# Patient Record
Sex: Male | Born: 1950 | Race: White | Hispanic: No | Marital: Married | Smoking: Never smoker
Health system: Southern US, Community
[De-identification: ages and names within clinical notes are randomized; demographics above are authoritative.]

## PROBLEM LIST (undated history)

## (undated) HISTORY — PX: NO PAST SURGERIES: SHX2092

---

## 2015-04-11 DIAGNOSIS — R1030 Lower abdominal pain, unspecified: Secondary | ICD-10-CM | POA: Diagnosis not present

## 2015-04-19 ENCOUNTER — Encounter: Payer: Self-pay | Admitting: *Deleted

## 2015-04-20 DIAGNOSIS — Z7689 Persons encountering health services in other specified circumstances: Secondary | ICD-10-CM | POA: Diagnosis not present

## 2015-04-20 DIAGNOSIS — Z125 Encounter for screening for malignant neoplasm of prostate: Secondary | ICD-10-CM | POA: Diagnosis not present

## 2015-04-20 DIAGNOSIS — Z0000 Encounter for general adult medical examination without abnormal findings: Secondary | ICD-10-CM | POA: Diagnosis not present

## 2015-04-28 ENCOUNTER — Ambulatory Visit (INDEPENDENT_AMBULATORY_CARE_PROVIDER_SITE_OTHER): Payer: 59 | Admitting: General Surgery

## 2015-04-28 ENCOUNTER — Encounter: Payer: Self-pay | Admitting: General Surgery

## 2015-04-28 VITALS — BP 110/68 | HR 68 | Resp 12 | Ht 68.0 in | Wt 175.0 lb

## 2015-04-28 DIAGNOSIS — K4090 Unilateral inguinal hernia, without obstruction or gangrene, not specified as recurrent: Secondary | ICD-10-CM

## 2015-04-28 NOTE — Progress Notes (Signed)
Patient ID: Alejandro Martinez, male   DOB: Aug 07, 1950, 65 y.o.   MRN: [id number]  Chief Complaint  Patient presents with  . Hernia    HPI Alejandro Martinez is a 65 y.o. male.  Here today for evaluation of inguinal hernia. He states he noticed a left groin knot about 3-4 weeks ago.  He was helping a friend move some snack/soda machines when he first became aware of discomfort in the area.He denies pain he states it is uncomfortable with sitting for prolonged periods of time. Bowels are regular. Her difficulty with urination. He works part time at [employer].  I personally reviewed the patient's history.  HPI  No past medical history on file.  No past surgical history on file.  Family History  Problem Relation Age of Onset  . Cancer Neg Hx     Social History Social History  Substance Use Topics  . Smoking status: Never Smoker   . Smokeless tobacco: Never Used  . Alcohol Use: No    No Known Allergies  No current outpatient prescriptions on file.   No current facility-administered medications for this visit.    Review of Systems Review of Systems  Constitutional: Negative.   Respiratory: Negative.   Cardiovascular: Negative.     Blood pressure 110/68, pulse 68, resp. rate 12, height 5\' 8"  (1.727 m), weight 175 lb (79.379 kg).  Physical Exam Physical Exam  Constitutional: He is oriented to person, place, and time. He appears well-developed and well-nourished.  HENT:  Mouth/Throat: Oropharynx is clear and moist.  Eyes: Conjunctivae are normal. No scleral icterus.  Neck: Neck supple.  Cardiovascular: Normal rate, regular rhythm and normal heart sounds.   Pulmonary/Chest: Effort normal and breath sounds normal.  Abdominal: A hernia is present. Hernia confirmed positive in the left inguinal area.  Diastasis recti present.  Genitourinary:     Left reducible inguinal hernia as well as a 2 cm dermal cyst.  Lymphadenopathy:    He has no cervical adenopathy.   Neurological: He is alert and oriented to person, place, and time.  Skin: Skin is warm and dry.  Psychiatric: His behavior is normal.    Data Reviewed  Notes from [facility] walls, D.O. of 04/11/2015 reviewed. Cystic mass noted in the left groin.  Assessment:    Reducible left inguinal hernia.  Sebaceous cyst of the left groin skin.   Plan        Hernia precautions and incarceration were discussed with the patient. If they develop symptoms of an incarcerated hernia, they were encouraged to seek prompt medical attention.  Cyst excision would be undertaken at the same setting to minimize the risk of it later infection.   I have recommended repair of the hernia using mesh on an outpatient basis in the near future. The risk of infection was reviewed. The role of prosthetic mesh to minimize the risk of recurrence was reviewed.  Patient's surgery has been scheduled for 05-05-15 at [facility].    PCP: Dr Maryland Pink  This information has been scribed by Karie Fetch RNBC.   Alejandro Martinez 04/29/2015, 9:24 AM

## 2015-04-28 NOTE — Patient Instructions (Addendum)
The patient is aware to call back for any questions or concerns. Inguinal Hernia, Adult Muscles help keep everything in the body in its proper place. But if a weak spot in the muscles develops, something can poke through. That is called a hernia. When this happens in the lower part of the belly (abdomen), it is called an inguinal hernia. (It takes its name from a part of the body in this region called the inguinal canal.) A weak spot in the wall of muscles lets some fat or part of the small intestine bulge through. An inguinal hernia can develop at any age. Men get them more often than women. CAUSES  In adults, an inguinal hernia develops over time.  It can be triggered by:  Suddenly straining the muscles of the lower abdomen.  Lifting heavy objects.  Straining to have a bowel movement. Difficult bowel movements (constipation) can lead to this.  Constant coughing. This may be caused by smoking or lung disease.  Being overweight.  Being pregnant.  Working at a job that requires long periods of standing or heavy lifting.  Having had an inguinal hernia before. One type can be an emergency situation. It is called a strangulated inguinal hernia. It develops if part of the small intestine slips through the weak spot and cannot get back into the abdomen. The blood supply can be cut off. If that happens, part of the intestine may die. This situation requires emergency surgery. SYMPTOMS  Often, a small inguinal hernia has no symptoms. It is found when a healthcare provider does a physical exam. Larger hernias usually have symptoms.   In adults, symptoms may include:  A lump in the groin. This is easier to see when the person is standing. It might disappear when lying down.  In men, a lump in the scrotum.  Pain or burning in the groin. This occurs especially when lifting, straining or coughing.  A dull ache or feeling of pressure in the groin.  Signs of a strangulated hernia can  include:  A bulge in the groin that becomes very painful and tender to the touch.  A bulge that turns red or purple.  Fever, nausea and vomiting.  Inability to have a bowel movement or to pass gas. DIAGNOSIS  To decide if you have an inguinal hernia, a healthcare provider will probably do a physical examination.  This will include asking questions about any symptoms you have noticed.  The healthcare provider might feel the groin area and ask you to cough. If an inguinal hernia is felt, the healthcare provider may try to slide it back into the abdomen.  Usually no other tests are needed. TREATMENT  Treatments can vary. The size of the hernia makes a difference. Options include:  Watchful waiting. This is often suggested if the hernia is small and you have had no symptoms.  No medical procedure will be done unless symptoms develop.  You will need to watch closely for symptoms. If any occur, contact your healthcare provider right away.  Surgery. This is used if the hernia is larger or you have symptoms.  Open surgery. This is usually an outpatient procedure (you will not stay overnight in a hospital). An cut (incision) is made through the skin in the groin. The hernia is put back inside the abdomen. The weak area in the muscles is then repaired by herniorrhaphy or hernioplasty. Herniorrhaphy: in this type of surgery, the weak muscles are sewn back together. Hernioplasty: a patch or mesh is   used to close the weak area in the abdominal wall.  Laparoscopy. In this procedure, a surgeon makes small incisions. A thin tube with a tiny video camera (called a laparoscope) is put into the abdomen. The surgeon repairs the hernia with mesh by looking with the video camera and using two long instruments. HOME CARE INSTRUCTIONS   After surgery to repair an inguinal hernia:  You will need to take pain medicine prescribed by your healthcare provider. Follow all directions carefully.  You will need  to take care of the wound from the incision.  Your activity will be restricted for awhile. This will probably include no heavy lifting for several weeks. You also should not do anything too active for a few weeks. When you can return to work will depend on the type of job that you have.  During "watchful waiting" periods, you should:  Maintain a healthy weight.  Eat a diet high in fiber (fruits, vegetables and whole grains).  Drink plenty of fluids to avoid constipation. This means drinking enough water and other liquids to keep your urine clear or pale yellow.  Do not lift heavy objects.  Do not stand for long periods of time.  Quit smoking. This should keep you from developing a frequent cough. SEEK MEDICAL CARE IF:   A bulge develops in your groin area.  You feel pain, a burning sensation or pressure in the groin. This might be worse if you are lifting or straining.  You develop a fever of more than 100.5 F (38.1 C). SEEK IMMEDIATE MEDICAL CARE IF:   Pain in the groin increases suddenly.  A bulge in the groin gets bigger suddenly and does not go down.  For men, there is sudden pain in the scrotum. Or, the size of the scrotum increases.  A bulge in the groin area becomes red or purple and is painful to touch.  You have nausea or vomiting that does not go away.  You feel your heart beating much faster than normal.  You cannot have a bowel movement or pass gas.  You develop a fever of more than 102.0 F (38.9 C).   This information is not intended to replace advice given to you by your health care provider. Make sure you discuss any questions you have with your health care provider.   Document Released: 07/29/2008 Document Revised: 06/04/2011 Document Reviewed: 09/13/2014 Elsevier Interactive Patient Education 2016 [employer].  Patient's surgery has been scheduled for 05-05-15 at [facility].

## 2015-04-29 DIAGNOSIS — K469 Unspecified abdominal hernia without obstruction or gangrene: Secondary | ICD-10-CM | POA: Insufficient documentation

## 2015-04-29 DIAGNOSIS — K4090 Unilateral inguinal hernia, without obstruction or gangrene, not specified as recurrent: Secondary | ICD-10-CM | POA: Insufficient documentation

## 2015-04-29 NOTE — H&P (Signed)
HPI  Alejandro Martinez is a 65 y.o. male. Here today for evaluation of inguinal hernia. He states he noticed a left groin knot about 3-4 weeks ago. He was helping a friend move some snack/soda machines when he first became aware of discomfort in the area.He denies pain he states it is uncomfortable with sitting for prolonged periods of time. Bowels are regular. Her difficulty with urination.  He works part time at [employer].  I personally reviewed the patient's history.  HPI  No past medical history on file.  No past surgical history on file.  Family History   Problem  Relation  Age of Onset   .  Cancer  Neg Hx     Social History  Social History   Substance Use Topics   .  Smoking status:  Never Smoker   .  Smokeless tobacco:  Never Used   .  Alcohol Use:  No    No Known Allergies  No current outpatient prescriptions on file.    No current facility-administered medications for this visit.    Review of Systems  Review of Systems  Constitutional: Negative.  Respiratory: Negative.  Cardiovascular: Negative.   Blood pressure 110/68, pulse 68, resp. rate 12, height 5\' 8"  (1.727 m), weight 175 lb (79.379 kg).  Physical Exam  Physical Exam  Constitutional: He is oriented to person, place, and time. He appears well-developed and well-nourished.  HENT:  Mouth/Throat: Oropharynx is clear and moist.  Eyes: Conjunctivae are normal. No scleral icterus.  Neck: Neck supple.  Cardiovascular: Normal rate, regular rhythm and normal heart sounds.  Pulmonary/Chest: Effort normal and breath sounds normal.  Abdominal: A hernia is present. Hernia confirmed positive in the left inguinal area.  Diastasis recti present.  Genitourinary:     Left reducible inguinal hernia as well as a 2 cm dermal cyst.  Lymphadenopathy:  He has no cervical adenopathy.  Neurological: He is alert and oriented to person, place, and time.  Skin: Skin is warm and dry.  Psychiatric: His behavior is normal.   Data  Reviewed  Notes from [facility] walls, D.O. of 04/11/2015 reviewed. Cystic mass noted in the left groin.  Assessment:   Reducible left inguinal hernia.  Sebaceous cyst of the left groin skin.  Plan    Hernia precautions and incarceration were discussed with the patient. If they develop symptoms of an incarcerated hernia, they were encouraged to seek prompt medical attention.  Cyst excision would be undertaken at the same setting to minimize the risk of it later infection.  I have recommended repair of the hernia using mesh on an outpatient basis in the near future. The risk of infection was reviewed. The role of prosthetic mesh to minimize the risk of recurrence was reviewed.  Patient's surgery has been scheduled for 05-05-15 at [facility].  PCP: Dr Maryland Pink  This information has been scribed by Karie Fetch RNBC.  Robert Bellow

## 2015-05-02 ENCOUNTER — Other Ambulatory Visit: Payer: Self-pay

## 2015-05-03 ENCOUNTER — Inpatient Hospital Stay: Admission: RE | Admit: 2015-05-03 | Payer: Self-pay | Source: Ambulatory Visit

## 2015-05-03 ENCOUNTER — Encounter: Payer: Self-pay | Admitting: *Deleted

## 2015-05-03 NOTE — Patient Instructions (Addendum)
  Your procedure is scheduled on: 05-05-15 Report to [facility]. To find out your arrival time please call [phone] between 1PM - 3PM on 05-04-15  Remember: Instructions that are not followed completely may result in serious medical risk, up to and including death, or upon the discretion of your surgeon and anesthesiologist your surgery may need to be rescheduled.    _X___ 1. Do not eat food or drink liquids after midnight. No gum chewing or hard candies.     _X___ 2. No Alcohol for 24 hours before or after surgery.   ____ 3. Bring all medications with you on the day of surgery if instructed.    ____ 4. Notify your doctor if there is any change in your medical condition     (cold, fever, infections).     Do not wear jewelry, make-up, hairpins, clips or nail polish.  Do not wear lotions, powders, or perfumes. You may wear deodorant.  Do not shave 48 hours prior to surgery. Men may shave face and neck.  Do not bring valuables to the hospital.    [facility] is not responsible for any belongings or valuables.               Contacts, dentures or bridgework may not be worn into surgery.  Leave your suitcase in the car. After surgery it may be brought to your room.  For patients admitted to the hospital, discharge time is determined by your treatment team.   Patients discharged the day of surgery will not be allowed to drive home.   Please read over the following fact sheets that you were given:    ____ Take these medicines the morning of surgery with A SIP OF WATER:    1. NONE  2.   3.   4.  5.  6.  ____ Fleet Enema (as directed)   ____ Use CHG Soap as directed-BATHE IN DIAL SOAP THE NIGHT BEFORE AND MORNING OF SURGERY  ____ Use inhalers on the day of surgery  ____ Stop metformin 2 days prior to surgery    ____ Take 1/2 of usual insulin dose the night before surgery and none on the morning of surgery.   ____ Stop  Coumadin/Plavix/aspirin-N/A  ____ Stop Anti-inflammatories   ____ Stop supplements until after surgery.    ____ Bring C-Pap to the hospital.

## 2015-05-05 ENCOUNTER — Ambulatory Visit
Admission: RE | Admit: 2015-05-05 | Discharge: 2015-05-05 | Disposition: A | Discharge status: Home / Self Care | Payer: 59 | Source: Ambulatory Visit | Attending: General Surgery | Admitting: General Surgery

## 2015-05-05 ENCOUNTER — Encounter
Admission: RE | Disposition: A | Discharge status: Home / Self Care | Payer: Self-pay | Source: Ambulatory Visit | Attending: General Surgery

## 2015-05-05 ENCOUNTER — Ambulatory Visit: Payer: 59 | Admitting: Anesthesiology

## 2015-05-05 DIAGNOSIS — D171 Benign lipomatous neoplasm of skin and subcutaneous tissue of trunk: Secondary | ICD-10-CM | POA: Diagnosis not present

## 2015-05-05 DIAGNOSIS — K4090 Unilateral inguinal hernia, without obstruction or gangrene, not specified as recurrent: Secondary | ICD-10-CM | POA: Insufficient documentation

## 2015-05-05 DIAGNOSIS — D1779 Benign lipomatous neoplasm of other sites: Secondary | ICD-10-CM | POA: Diagnosis not present

## 2015-05-05 HISTORY — PX: INGUINAL HERNIA REPAIR: SHX194

## 2015-05-05 HISTORY — PX: LIPOMA EXCISION: SHX5283

## 2015-05-05 SURGERY — REPAIR, HERNIA, INGUINAL, ADULT
Anesthesia: General | Laterality: Left | Wound class: Clean

## 2015-05-05 MED ORDER — LIDOCAINE HCL (CARDIAC) 20 MG/ML IV SOLN: INTRAVENOUS | Status: DC | PRN

## 2015-05-05 MED ORDER — MIDAZOLAM HCL 2 MG/2ML IJ SOLN
INTRAMUSCULAR | Status: DC | PRN
  Administered 2015-05-05: 2 mg via INTRAVENOUS

## 2015-05-05 MED ORDER — NALOXONE HCL 0.4 MG/ML IJ SOLN
INTRAMUSCULAR | Status: DC | PRN
  Administered 2015-05-05: 40 ug via INTRAVENOUS

## 2015-05-05 MED ORDER — PROPOFOL 10 MG/ML IV BOLUS
INTRAVENOUS | Status: DC | PRN
  Administered 2015-05-05: 30 mg via INTRAVENOUS
  Administered 2015-05-05: 170 mg via INTRAVENOUS

## 2015-05-05 MED ORDER — BUPIVACAINE-EPINEPHRINE (PF) 0.5% -1:200000 IJ SOLN
INTRAMUSCULAR | Status: AC
  Filled 2015-05-05: qty 30

## 2015-05-05 MED ORDER — HYDROCODONE-ACETAMINOPHEN 5-325 MG PO TABS: 1.0000 | ORAL_TABLET | ORAL | Status: DC | PRN

## 2015-05-05 MED ORDER — FAMOTIDINE 20 MG PO TABS
ORAL_TABLET | ORAL | Status: AC
Start: 2015-05-05 — End: 2015-05-05
  Administered 2015-05-05: 20 mg via ORAL
  Filled 2015-05-05: qty 1

## 2015-05-05 MED ORDER — CEFAZOLIN SODIUM-DEXTROSE 2-3 GM-% IV SOLR
2.0000 g | INTRAVENOUS | Status: AC
  Administered 2015-05-05: 2 g via INTRAVENOUS

## 2015-05-05 MED ORDER — ACETAMINOPHEN 10 MG/ML IV SOLN
INTRAVENOUS | Status: AC
  Filled 2015-05-05: qty 100

## 2015-05-05 MED ORDER — LACTATED RINGERS IV SOLN
INTRAVENOUS | Status: DC
  Administered 2015-05-05 (×2): via INTRAVENOUS

## 2015-05-05 MED ORDER — CEFAZOLIN SODIUM-DEXTROSE 2-3 GM-% IV SOLR
INTRAVENOUS | Status: AC
  Administered 2015-05-05: 2 g via INTRAVENOUS
  Filled 2015-05-05: qty 50

## 2015-05-05 MED ORDER — FAMOTIDINE 20 MG PO TABS
20.0000 mg | ORAL_TABLET | Freq: Once | ORAL | Status: AC
  Administered 2015-05-05: 20 mg via ORAL

## 2015-05-05 MED ORDER — BUPIVACAINE-EPINEPHRINE (PF) 0.5% -1:200000 IJ SOLN
INTRAMUSCULAR | Status: DC | PRN
  Administered 2015-05-05: 30 mL via PERINEURAL

## 2015-05-05 MED ORDER — ONDANSETRON HCL 4 MG/2ML IJ SOLN
4.0000 mg | Freq: Once | INTRAMUSCULAR | Status: DC | PRN
Start: 2015-05-05 — End: 2015-05-05

## 2015-05-05 MED ORDER — DEXAMETHASONE SODIUM PHOSPHATE 10 MG/ML IJ SOLN
INTRAMUSCULAR | Status: DC | PRN
  Administered 2015-05-05: 10 mg via INTRAVENOUS

## 2015-05-05 MED ORDER — FENTANYL CITRATE (PF) 100 MCG/2ML IJ SOLN
INTRAMUSCULAR | Status: DC | PRN
  Administered 2015-05-05: 25 ug via INTRAVENOUS
  Administered 2015-05-05: 50 ug via INTRAVENOUS
  Administered 2015-05-05: 25 ug via INTRAVENOUS

## 2015-05-05 MED ORDER — ACETAMINOPHEN 10 MG/ML IV SOLN
INTRAVENOUS | Status: DC | PRN
  Administered 2015-05-05: 1000 mg via INTRAVENOUS

## 2015-05-05 MED ORDER — KETOROLAC TROMETHAMINE 30 MG/ML IJ SOLN
INTRAMUSCULAR | Status: DC | PRN
  Administered 2015-05-05: 30 mg

## 2015-05-05 MED ORDER — FENTANYL CITRATE (PF) 100 MCG/2ML IJ SOLN: 25.0000 ug | INTRAMUSCULAR | Status: DC | PRN

## 2015-05-05 SURGICAL SUPPLY — 34 items
BENZOIN TINCTURE PRP APPL 2/3 (GAUZE/BANDAGES/DRESSINGS) ×3 IMPLANT
BLADE SURG 15 STRL SS SAFETY (BLADE) ×6 IMPLANT
CANISTER SUCT 1200ML W/VALVE (MISCELLANEOUS) ×3 IMPLANT
CHLORAPREP W/TINT 26ML (MISCELLANEOUS) ×3 IMPLANT
CLOSURE WOUND 1/2 X4 (GAUZE/BANDAGES/DRESSINGS) ×1
DECANTER SPIKE VIAL GLASS SM (MISCELLANEOUS) ×3 IMPLANT
DRAIN PENROSE 1/4X12 LTX (DRAIN) ×3 IMPLANT
DRAPE LAPAROTOMY 100X77 ABD (DRAPES) ×3 IMPLANT
DRESSING TELFA 4X3 1S ST N-ADH (GAUZE/BANDAGES/DRESSINGS) ×3 IMPLANT
DRSG TEGADERM 4X4.75 (GAUZE/BANDAGES/DRESSINGS) ×3 IMPLANT
ELECT REM PT RETURN 9FT ADLT (ELECTROSURGICAL) ×3
ELECTRODE REM PT RTRN 9FT ADLT (ELECTROSURGICAL) ×1 IMPLANT
GLOVE BIO SURGEON STRL SZ7.5 (GLOVE) ×9 IMPLANT
GLOVE INDICATOR 8.0 STRL GRN (GLOVE) ×9 IMPLANT
GOWN STRL REUS W/ TWL LRG LVL3 (GOWN DISPOSABLE) ×3 IMPLANT
GOWN STRL REUS W/TWL LRG LVL3 (GOWN DISPOSABLE) ×6
KIT RM TURNOVER STRD PROC AR (KITS) ×3 IMPLANT
LABEL OR SOLS (LABEL) ×3 IMPLANT
MESH HERNIA SYS ULTRAPRO LRG (Mesh General) ×3 IMPLANT
NDL SAFETY 22GX1.5 (NEEDLE) ×6 IMPLANT
NEEDLE HYPO 25X1 1.5 SAFETY (NEEDLE) ×3 IMPLANT
PACK BASIN MINOR ARMC (MISCELLANEOUS) ×3 IMPLANT
STRIP CLOSURE SKIN 1/2X4 (GAUZE/BANDAGES/DRESSINGS) ×2 IMPLANT
SUT SURGILON 0 BLK (SUTURE) ×3 IMPLANT
SUT VIC AB 2-0 SH 27 (SUTURE) ×2
SUT VIC AB 2-0 SH 27XBRD (SUTURE) ×1 IMPLANT
SUT VIC AB 3-0 54X BRD REEL (SUTURE) ×1 IMPLANT
SUT VIC AB 3-0 BRD 54 (SUTURE) ×2
SUT VIC AB 3-0 SH 27 (SUTURE) ×2
SUT VIC AB 3-0 SH 27X BRD (SUTURE) ×1 IMPLANT
SUT VIC AB 4-0 FS2 27 (SUTURE) ×3 IMPLANT
SWABSTK COMLB BENZOIN TINCTURE (MISCELLANEOUS) ×3 IMPLANT
SYR 3ML LL SCALE MARK (SYRINGE) ×3 IMPLANT
SYR CONTROL 10ML (SYRINGE) ×6 IMPLANT

## 2015-05-05 NOTE — Discharge Instructions (Signed)
AMBULATORY SURGERY  DISCHARGE INSTRUCTIONS   1) The drugs that you were given will stay in your system until tomorrow so for the next 24 hours you should not:  A) Drive an automobile B) Make any legal decisions C) Drink any alcoholic beverage   2) You may resume regular meals tomorrow.  Today it is better to start with liquids and gradually work up to solid foods.  You may eat anything you prefer, but it is better to start with liquids, then soup and crackers, and gradually work up to solid foods.   3) Please notify your doctor immediately if you have any unusual bleeding, trouble breathing, redness and pain at the surgery site, drainage, fever, or pain not relieved by medication.    4) Additional Instructions:        Please contact your physician with any problems or Same Day Surgery at [phone], Monday through Friday 6 am to 4 pm, or [place] at [facility] number at [phone]. Hernia A hernia happens when an organ or tissue inside your body pushes out through a weak spot in the belly (abdomen). HOME CARE  Avoid stretching or overusing (straining) the muscles near the hernia.  Do not lift anything heavier than 10 lb (4.5 kg).  Use the muscles in your leg when you lift something up. Do not use the muscles in your back.  When you cough, try to cough gently.  Eat a diet that has a lot of fiber. Eat lots of fruits and vegetables.  Drink enough fluids to keep your pee (urine) clear or pale yellow. Try to drink 6-8 glasses of water a day.  Take medicines to make your poop soft (stool softeners) as told by your doctor.  Lose weight, if you are overweight.  Do not use any tobacco products, including cigarettes, chewing tobacco, or electronic cigarettes. If you need help quitting, ask your doctor.  Keep all follow-up visits as told by your doctor. This is important. GET HELP IF:  The skin by the hernia gets puffy (swollen) or red.  The hernia is  painful. GET HELP RIGHT AWAY IF:  You have a fever.  You have belly pain that is getting worse.  You feel sick to your stomach (nauseous) or you throw up (vomit).  You cannot push the hernia back in place by gently pressing on it while you are lying down.  The hernia:  Changes in shape or size.  Is stuck outside your belly.  Changes color.  Feels hard or tender.   This information is not intended to replace advice given to you by your health care provider. Make sure you discuss any questions you have with your health care provider.   Document Released: 08/30/2009 Document Revised: 04/02/2014 Document Reviewed: 01/20/2014 Elsevier Interactive Patient Education 2016 [place]   5) The drugs that you were given will stay in your system until tomorrow so for the next 24 hours you should not:  D) Drive an automobile E) Make any legal decisions F) Drink any alcoholic beverage   6) You may resume regular meals tomorrow.  Today it is better to start with liquids and gradually work up to solid foods.  You may eat anything you prefer, but it is better to start with liquids, then soup and crackers, and gradually work up to solid foods.   7) Please notify your doctor immediately if you have any unusual bleeding, trouble breathing, redness and pain at the surgery  site, drainage, fever, or pain not relieved by medication.    8) Additional Instructions:        Please contact your physician with any problems or Same Day Surgery at [phone], Monday through Friday 6 am to 4 pm, or  at [facility] number at [phone].

## 2015-05-05 NOTE — Transfer of Care (Signed)
Immediate Anesthesia Transfer of Care Note  Patient: Alejandro Martinez  Procedure(s) Performed: Procedure(s): HERNIA REPAIR INGUINAL ADULT WITH MESH (Left) EXCISION OF LEFT INGIUNAL  LIPOMA (Left)  Patient Location: PACU  Anesthesia Type:General  Level of Consciousness: sedated  Airway & Oxygen Therapy: Patient Spontanous Breathing and Patient connected to face mask oxygen  Post-op Assessment: Report given to RN and Post -op Vital signs reviewed and stable  Post vital signs: Reviewed and stable  Last Vitals:  Filed Vitals:   05/05/15 0901 05/05/15 1134  BP: 132/87 117/79  Pulse: 62 59  Temp: 36.8 C 36.2 C  Resp: 14 13    Complications: No apparent anesthesia complications

## 2015-05-05 NOTE — Op Note (Signed)
Preoperative diagnosis: Left inguinal hernia, left groin cyst.  Postoperative diagnosis: Left direct inguinal hernia, left groin lipoma.  Operative procedure: Excision of left groin lipoma, repair of left direct inguinal hernia with large Ultra Pro mesh.  Operating surgeon: Ollen Bowl, M.D.  Anesthesia: Gen. by LMA; Marcaine 0.5% with 1-200,000 epinephrine, 30 mL; Toradol: 30 mg  Estimated blood loss: Less than 5 mL.  Clinical note this 65 year old male has developed a symptomatic left inguinal hernia. He is also noted to have a nodular area in the groin thought to represent a noninfected cyst. He was admitted for what repair and excision of the hernia and excision of the lesion in the left groin.  Operative note: With the patient under adequate general anesthesia, here having been previously removed with clippers and having received 2 g of Kefzol intravenously area was prepped with ChloraPrep and draped. A 5 cm skin line incision along the anticipated course inguinal canal was carried aphthous consulting this tissue with hemostasis achieved by Dr. cautery. The subcutis mass was found to be a lipoma. This was excised and sent to pathology. The external oblique was opened in the direction of its fibers. The cord was dissected back to the level of the internal ring. A small indirect hernia was circumferentially freed. There was a large direct hernia. The preperitoneal space was cleared and a large Ultra Pro mesh placed into the preperitoneal space and smoothed into position. The external component was anchored to the pubic tubercle with 0 Surgilon sutures. The medial and superior borders were anchored to the transverse abdominis aponeurosis and the inguinal ligament was used to anchor the inferior border. Both the ilioinguinal and iliohypogastric nerves were identified and protected. The external oblique was closed with a running 2-0 Vicryl suture. The layer of Scarpa's fascia was closed with a  running 3-0 Vicryl suture and the skin closed with running 4-0 Vicryl subcuticular suture.  The wound was then covered with benzoin, Steri-Strips, Telfa and Tegaderm dressing.  The patient tolerated the procedure well was taken to recovery in stable condition.

## 2015-05-05 NOTE — Anesthesia Postprocedure Evaluation (Signed)
Anesthesia Post Note  Patient: Alejandro Martinez  Procedure(s) Performed: Procedure(s) (LRB): HERNIA REPAIR INGUINAL ADULT WITH MESH (Left) EXCISION OF LEFT INGIUNAL  LIPOMA (Left)  Patient location during evaluation: PACU Anesthesia Type: General Level of consciousness: awake and alert Pain management: pain level controlled Vital Signs Assessment: post-procedure vital signs reviewed and stable Respiratory status: spontaneous breathing and respiratory function stable Cardiovascular status: stable Anesthetic complications: no    Last Vitals:  Filed Vitals:   05/05/15 1312 05/05/15 1332  BP: 113/70 117/76  Pulse: 52 54  Temp:    Resp: 16 16    Last Pain:  Filed Vitals:   05/05/15 1334  PainSc: Asleep                 KEPHART,WILLIAM K

## 2015-05-05 NOTE — H&P (Signed)
No change in clinical exam or history.  Plan: Remove skin cyst and repair left inguinal hernia.

## 2015-05-05 NOTE — Anesthesia Procedure Notes (Signed)
Procedure Name: LMA Insertion Performed by: Dionne Bucy Pre-anesthesia Checklist: Patient identified, Patient being monitored, Timeout performed, Emergency Drugs available and Suction available Patient Re-evaluated:Patient Re-evaluated prior to inductionOxygen Delivery Method: Circle system utilized Preoxygenation: Pre-oxygenation with 100% oxygen Intubation Type: IV induction Ventilation: Mask ventilation without difficulty LMA: LMA inserted LMA Size: 5.0 Tube type: Oral Number of attempts: 1 Placement Confirmation: positive ETCO2 and breath sounds checked- equal and bilateral Tube secured with: Tape Dental Injury: Teeth and Oropharynx as per pre-operative assessment

## 2015-05-05 NOTE — Anesthesia Preprocedure Evaluation (Signed)
Anesthesia Evaluation  Patient identified by MRN, date of birth, ID band Patient awake    Reviewed: Allergy & Precautions, NPO status , Patient's Chart, lab work & pertinent test results  History of Anesthesia Complications Negative for: history of anesthetic complications  Airway Mallampati: II       Dental  (+) Teeth Intact   Pulmonary neg pulmonary ROS,           Cardiovascular negative cardio ROS       Neuro/Psych negative neurological ROS     GI/Hepatic negative GI ROS, Neg liver ROS,   Endo/Other  negative endocrine ROS  Renal/GU negative Renal ROS     Musculoskeletal   Abdominal   Peds  Hematology negative hematology ROS (+)   Anesthesia Other Findings   Reproductive/Obstetrics                             Anesthesia Physical Anesthesia Plan  ASA: I  Anesthesia Plan: General   Post-op Pain Management:    Induction: Intravenous  Airway Management Planned: Oral ETT  Additional Equipment:   Intra-op Plan:   Post-operative Plan:   Informed Consent: I have reviewed the patients History and Physical, chart, labs and discussed the procedure including the risks, benefits and alternatives for the proposed anesthesia with the patient or authorized representative who has indicated his/her understanding and acceptance.     Plan Discussed with:   Anesthesia Plan Comments:         Anesthesia Quick Evaluation

## 2015-05-05 NOTE — OR Nursing (Signed)
Dr Bary Castilla given update on patient pt ready for discharge

## 2015-05-06 ENCOUNTER — Encounter: Payer: Self-pay | Admitting: General Surgery

## 2015-05-06 LAB — SURGICAL PATHOLOGY

## 2015-05-12 ENCOUNTER — Encounter: Payer: Self-pay | Admitting: General Surgery

## 2015-05-12 ENCOUNTER — Ambulatory Visit (INDEPENDENT_AMBULATORY_CARE_PROVIDER_SITE_OTHER): Payer: 59 | Admitting: General Surgery

## 2015-05-12 VITALS — BP 124/78 | HR 74 | Resp 12 | Ht 68.0 in | Wt 178.0 lb

## 2015-05-12 DIAGNOSIS — K4090 Unilateral inguinal hernia, without obstruction or gangrene, not specified as recurrent: Secondary | ICD-10-CM

## 2015-05-12 NOTE — Patient Instructions (Addendum)
The patient is aware to call back for any questions or concerns.    Proper lifting techniques reviewed. Resume activities as tolerated. 

## 2015-05-12 NOTE — Progress Notes (Signed)
Patient ID: Alejandro Martinez, male   DOB: 10-Feb-1951, 65 y.o.   MRN: [id number]  Chief Complaint  Patient presents with  . Routine Post Op    HPI Alejandro Martinez is a 65 y.o. male.  Here today for postoperative visit, inguinal hernia on 05-05-15. He did admit to having a headache on Friday and Saturday. He states he is doing well. No pain medication required at this time. Bowels moving regular. I personally reviewed the patient's history. HPI  No past medical history on file.  Past Surgical History  Procedure Laterality Date  . No past surgeries    . Inguinal hernia repair Left 05/05/2015    Procedure: HERNIA REPAIR INGUINAL ADULT WITH MESH;  Surgeon: Robert Bellow, MD;  Location: [facility] ORS;  Service: General;  Laterality: Left;  . Lipoma excision Left 05/05/2015    Procedure: EXCISION OF LEFT INGIUNAL  LIPOMA;  Surgeon: Robert Bellow, MD;  Location: [facility] ORS;  Service: General;  Laterality: Left;    Family History  Problem Relation Age of Onset  . Cancer Neg Hx     Social History Social History  Substance Use Topics  . Smoking status: Never Smoker   . Smokeless tobacco: Never Used  . Alcohol Use: No    No Known Allergies  No current outpatient prescriptions on file.   No current facility-administered medications for this visit.    Review of Systems Review of Systems  Constitutional: Negative.   Respiratory: Negative.   Cardiovascular: Negative.     Blood pressure 124/78, pulse 74, resp. rate 12, height 5\' 8"  (1.727 m), weight 178 lb (80.74 kg).  Physical Exam Physical Exam  Constitutional: He is oriented to person, place, and time. He appears well-developed and well-nourished.  Abdominal: Soft. Normal appearance.  Left hernia repair intact.   Neurological: He is alert and oriented to person, place, and time.  Skin: Skin is warm and dry.  Psychiatric: His behavior is normal.      Assessment    Doing well status post left inguinal hernia repair.     Plan          Proper lifting techniques reviewed. Resume activities as tolerated. Follow up as needed. The patient is aware to call back for any questions or concerns.   PCP:  [place] This information has been scribed by Karie Fetch [place].  Robert Bellow 05/13/2015, 10:00 AM

## 2015-08-17 DIAGNOSIS — H5203 Hypermetropia, bilateral: Secondary | ICD-10-CM | POA: Diagnosis not present

## 2015-08-25 ENCOUNTER — Encounter: Payer: Self-pay | Admitting: General Surgery

## 2015-12-15 ENCOUNTER — Emergency Department: Payer: 59

## 2015-12-15 ENCOUNTER — Encounter: Payer: Self-pay | Admitting: Emergency Medicine

## 2015-12-15 ENCOUNTER — Emergency Department
Admission: EM | Admit: 2015-12-15 | Discharge: 2015-12-15 | Disposition: A | Discharge status: Home / Self Care | Payer: 59 | Attending: Emergency Medicine | Admitting: Emergency Medicine

## 2015-12-15 DIAGNOSIS — N200 Calculus of kidney: Secondary | ICD-10-CM | POA: Diagnosis not present

## 2015-12-15 DIAGNOSIS — R1031 Right lower quadrant pain: Secondary | ICD-10-CM | POA: Diagnosis present

## 2015-12-15 DIAGNOSIS — N132 Hydronephrosis with renal and ureteral calculous obstruction: Secondary | ICD-10-CM | POA: Diagnosis not present

## 2015-12-15 DIAGNOSIS — R109 Unspecified abdominal pain: Secondary | ICD-10-CM | POA: Diagnosis not present

## 2015-12-15 LAB — COMPREHENSIVE METABOLIC PANEL
ALK PHOS: 69 U/L (ref 38–126)
ALT: 18 U/L (ref 17–63)
ANION GAP: 9 (ref 5–15)
AST: 28 U/L (ref 15–41)
Albumin: 4.6 g/dL (ref 3.5–5.0)
BILIRUBIN TOTAL: 1.1 mg/dL (ref 0.3–1.2)
BUN: 19 mg/dL (ref 6–20)
CALCIUM: 9.3 mg/dL (ref 8.9–10.3)
CO2: 24 mmol/L (ref 22–32)
CREATININE: 1.2 mg/dL (ref 0.61–1.24)
Chloride: 107 mmol/L (ref 101–111)
Glucose, Bld: 189 mg/dL — ABNORMAL HIGH (ref 65–99)
Potassium: 3.2 mmol/L — ABNORMAL LOW (ref 3.5–5.1)
SODIUM: 140 mmol/L (ref 135–145)
TOTAL PROTEIN: 7.6 g/dL (ref 6.5–8.1)

## 2015-12-15 LAB — CBC WITH DIFFERENTIAL/PLATELET
Basophils Absolute: 0 10*3/uL (ref 0–0.1)
Basophils Relative: 0 %
EOS ABS: 0 10*3/uL (ref 0–0.7)
Eosinophils Relative: 0 %
HEMATOCRIT: 42.5 % (ref 40.0–52.0)
HEMOGLOBIN: 15 g/dL (ref 13.0–18.0)
LYMPHS ABS: 1.1 10*3/uL (ref 1.0–3.6)
LYMPHS PCT: 10 %
MCH: 33 pg (ref 26.0–34.0)
MCHC: 35.4 g/dL (ref 32.0–36.0)
MCV: 93.4 fL (ref 80.0–100.0)
MONOS PCT: 5 %
Monocytes Absolute: 0.6 10*3/uL (ref 0.2–1.0)
NEUTROS PCT: 85 %
Neutro Abs: 9 10*3/uL — ABNORMAL HIGH (ref 1.4–6.5)
Platelets: 205 10*3/uL (ref 150–440)
RBC: 4.54 MIL/uL (ref 4.40–5.90)
RDW: 12.5 % (ref 11.5–14.5)
WBC: 10.7 10*3/uL — ABNORMAL HIGH (ref 3.8–10.6)

## 2015-12-15 LAB — URINALYSIS COMPLETE WITH MICROSCOPIC (ARMC ONLY)
BILIRUBIN URINE: NEGATIVE
Bacteria, UA: NONE SEEN
Glucose, UA: 50 mg/dL — AB
KETONES UR: NEGATIVE mg/dL
Leukocytes, UA: NEGATIVE
NITRITE: NEGATIVE
PH: 5 (ref 5.0–8.0)
PROTEIN: 100 mg/dL — AB
Specific Gravity, Urine: 1.021 (ref 1.005–1.030)
Squamous Epithelial / LPF: NONE SEEN

## 2015-12-15 LAB — LIPASE, BLOOD: Lipase: 20 U/L (ref 11–51)

## 2015-12-15 MED ORDER — CEPHALEXIN 500 MG PO CAPS
500.0000 mg | ORAL_CAPSULE | Freq: Once | ORAL | Status: AC
  Administered 2015-12-15: 500 mg via ORAL
  Filled 2015-12-15: qty 1

## 2015-12-15 MED ORDER — ONDANSETRON HCL 4 MG/2ML IJ SOLN
INTRAMUSCULAR | Status: AC
  Administered 2015-12-15: 4 mg
  Filled 2015-12-15: qty 2

## 2015-12-15 MED ORDER — CEPHALEXIN 500 MG PO CAPS: 500.0000 mg | ORAL_CAPSULE | Freq: Three times a day (TID) | ORAL | 0 refills | Status: AC

## 2015-12-15 MED ORDER — TAMSULOSIN HCL 0.4 MG PO CAPS: 0.4000 mg | ORAL_CAPSULE | Freq: Every day | ORAL | 0 refills | Status: DC

## 2015-12-15 MED ORDER — OXYCODONE-ACETAMINOPHEN 5-325 MG PO TABS: 1.0000 | ORAL_TABLET | Freq: Four times a day (QID) | ORAL | 0 refills | Status: DC | PRN

## 2015-12-15 MED ORDER — SODIUM CHLORIDE 0.9 % IV BOLUS (SEPSIS)
1000.0000 mL | Freq: Once | INTRAVENOUS | Status: AC
  Administered 2015-12-15: 1000 mL via INTRAVENOUS

## 2015-12-15 MED ORDER — KETOROLAC TROMETHAMINE 30 MG/ML IJ SOLN
30.0000 mg | Freq: Once | INTRAMUSCULAR | Status: AC
  Administered 2015-12-15: 30 mg via INTRAVENOUS
  Filled 2015-12-15: qty 1

## 2015-12-15 MED ORDER — MORPHINE SULFATE (PF) 4 MG/ML IV SOLN
INTRAVENOUS | Status: AC
  Administered 2015-12-15: 4 mg
  Filled 2015-12-15: qty 1

## 2015-12-15 NOTE — ED Triage Notes (Signed)
Pt presents with right side flank pain since yesterday. Pt reports vomiting once. Denies diarrhea. Denies dysuria.

## 2015-12-15 NOTE — ED Provider Notes (Signed)
[facility] Emergency Department Provider Note   ____________________________________________   First MD Initiated Contact with Patient 12/15/15 0813     (approximate)  I have reviewed the triage vital signs and the nursing notes.   HISTORY  Chief Complaint Flank Pain   HPI Alejandro Martinez is a 65 y.o. male with a history of a left-sided inguinal hernia who is presenting to emergency department with sudden onset right flank pain over the past one hour. He says that he has vomited and continues to feel nauseous. He says the pain is a 10 out of 10 and sharp. It is radiating around to the right lower quadrant. Denies any history of kidney stones.   History reviewed. No pertinent past medical history.  Patient Active Problem List   Diagnosis Date Noted  . Inguinal hernia 04/29/2015    Past Surgical History:  Procedure Laterality Date  . INGUINAL HERNIA REPAIR Left 05/05/2015   Procedure: HERNIA REPAIR INGUINAL ADULT WITH MESH;  Surgeon: Robert Bellow, MD;  Location: [facility] ORS;  Service: General;  Laterality: Left;  . LIPOMA EXCISION Left 05/05/2015   Procedure: EXCISION OF LEFT INGIUNAL  LIPOMA;  Surgeon: Robert Bellow, MD;  Location: [facility] ORS;  Service: General;  Laterality: Left;  . NO PAST SURGERIES      Prior to Admission medications   Not on File    Allergies Review of patient's allergies indicates no known allergies.  Family History  Problem Relation Age of Onset  . Cancer Neg Hx     Social History Social History  Substance Use Topics  . Smoking status: Never Smoker  . Smokeless tobacco: Never Used  . Alcohol use No    Review of Systems Constitutional: No fever/chills Eyes: No visual changes. ENT: No sore throat. Cardiovascular: Denies chest pain. Respiratory: Denies shortness of breath. Gastrointestinal:  No diarrhea.  No constipation. Genitourinary: Negative for dysuria. Musculoskeletal: Right-sided flank pain and back  pain. Skin: Negative for rash. Neurological: Negative for headaches, focal weakness or numbness.  10-point ROS otherwise negative.  ____________________________________________   PHYSICAL EXAM:  VITAL SIGNS: ED Triage Vitals  Enc Vitals Group     BP 12/15/15 0819 (!) 141/81     Pulse Rate 12/15/15 0819 (!) 59     Resp --      Temp 12/15/15 0819 97.8 F (36.6 C)     Temp Source 12/15/15 0819 Oral     SpO2 12/15/15 0819 100 %     Weight 12/15/15 0820 175 lb (79.4 kg)     Height 12/15/15 0820 5\' 8"  (1.727 m)     Head Circumference --      Peak Flow --      Pain Score 12/15/15 0820 10     Pain Loc --      Pain Edu? --      Excl. in [place]? --     Constitutional: Alert and oriented.Appears uncomfortable. Vomited times one while I was in the room. Eyes: Conjunctivae are normal. PERRL. EOMI. Head: Atraumatic. Nose: No congestion/rhinnorhea. Mouth/Throat: Mucous membranes are moist.  Neck: No stridor.   Cardiovascular: Normal rate, regular rhythm. Grossly normal heart sounds.   Respiratory: Normal respiratory effort.  No retractions. Lungs CTAB. Gastrointestinal: Soft and nontender. No distention. Mild to moderate right-sided CVA tenderness palpation. Musculoskeletal: No lower extremity tenderness nor edema.  No joint effusions. Neurologic:  Normal speech and language. No gross focal neurologic deficits are appreciated.  Skin:  Skin is warm, dry  and intact. No rash noted. Psychiatric: Mood and affect are normal. Speech and behavior are normal.  ____________________________________________   LABS (all labs ordered are listed, but only abnormal results are displayed)  Labs Reviewed  CBC WITH DIFFERENTIAL/PLATELET - Abnormal; Notable for the following:       Result Value   WBC 10.7 (*)    Neutro Abs 9.0 (*)    All other components within normal limits  COMPREHENSIVE METABOLIC PANEL - Abnormal; Notable for the following:    Potassium 3.2 (*)    Glucose, Bld 189 (*)    All  other components within normal limits  URINALYSIS COMPLETEWITH MICROSCOPIC ([facility] ONLY) - Abnormal; Notable for the following:    Color, Urine YELLOW (*)    APPearance HAZY (*)    Glucose, UA 50 (*)    Hgb urine dipstick 3+ (*)    Protein, ur 100 (*)    All other components within normal limits  LIPASE, BLOOD   ____________________________________________  EKG   ____________________________________________  RADIOLOGY CT RENAL STONE STUDY (Accession [id number]) (Order [id number])  Imaging  Date: 12/15/2015 Department: [facility] EMERGENCY DEPARTMENT Released By/Authorizing: Orbie Pyo, MD (auto-released)  PACS Images   Show images for CT RENAL STONE STUDY  Study Result   CLINICAL DATA:  Right-sided flank pain beginning yesterday.  EXAM: CT ABDOMEN AND PELVIS WITHOUT CONTRAST  TECHNIQUE: Multidetector CT imaging of the abdomen and pelvis was performed following the standard protocol without IV contrast.  COMPARISON:  None.  FINDINGS: Lower chest: Minimal linear atelectasis or scar at the lung bases. No pleural or pericardial fluid. Small hiatal hernia.  Hepatobiliary: Normal without contrast.  No calcified gallstones.  Pancreas: Normal  Spleen: Normal  Adrenals/Urinary Tract: Adrenal glands are normal. The left kidney is normal. No cyst, mass, stone or hydronephrosis. The right kidney contains 2 nonobstructing 2 mm stones in the lower pole. There is mild fullness of the renal collecting system and ureter with a stone at the pelvic brim level measuring 3.5-4 mm. No stone distal to that or in the bladder.  Stomach/Bowel: No acute bowel pathology. Diverticulosis without evidence of diverticulitis.  Vascular/Lymphatic: Aorta and IVC are normal. No retroperitoneal mass or lymphadenopathy.  Reproductive: Normal  Other: Previous left inguinal hernia repair.  Musculoskeletal: Ordinary lower lumbar degenerative  changes.  IMPRESSION: 3.5-4 mm stone in the right ureter at the pelvic brim level with mild right hydroureteronephrosis. Two tiny (2 mm) nonobstructing stones in the lower pole the right kidney.   Electronically Signed   By: Nelson Chimes M.D.   On: 12/15/2015 08:56     ____________________________________________   PROCEDURES  Procedure(s) performed:   Procedures  Critical Care performed:   ____________________________________________   INITIAL IMPRESSION / ASSESSMENT AND PLAN / ED COURSE  Pertinent labs & imaging results that were available during my care of the patient were reviewed by me and considered in my medical decision making (see chart for details).  ----------------------------------------- 10:04 AM on 12/15/2015 -----------------------------------------  Pain had come down to a 4 out of 10 after morphine but is now increased again. Will give Toradol. I also updated the patient has to his CT findings.  ----------------------------------------- 12:26 PM on 12/15/2015 -----------------------------------------  Patient is now pain-free. No longer vomiting or nauseous. I will be able to discharge him to home. We discussed his diagnosis including the radiology findings. He also knows to return immediately for any worsening or concerning symptoms especially fever, nausea or vomiting. He'll be following urology.  Several white blood cells in the urine. We'll send home with prophylactic antibiotics.  Clinical Course     ____________________________________________   FINAL CLINICAL IMPRESSION(S) / ED DIAGNOSES  Final diagnoses:  Right flank pain   Kidney stones    NEW MEDICATIONS STARTED DURING THIS VISIT:  New Prescriptions   No medications on file     Note:  This document was prepared using Dragon voice recognition software and may include unintentional dictation errors.    Orbie Pyo, MD 12/15/15 [phone]

## 2015-12-16 LAB — URINE CULTURE: Culture: NO GROWTH

## 2015-12-23 ENCOUNTER — Ambulatory Visit: Payer: Self-pay

## 2015-12-27 DIAGNOSIS — N201 Calculus of ureter: Secondary | ICD-10-CM | POA: Diagnosis not present

## 2015-12-27 DIAGNOSIS — R300 Dysuria: Secondary | ICD-10-CM | POA: Diagnosis not present

## 2016-07-04 ENCOUNTER — Encounter: Payer: Self-pay | Admitting: General Surgery

## 2016-07-04 ENCOUNTER — Ambulatory Visit (INDEPENDENT_AMBULATORY_CARE_PROVIDER_SITE_OTHER): Payer: 59 | Admitting: General Surgery

## 2016-07-04 VITALS — BP 130/80 | HR 80 | Resp 12 | Ht 68.0 in | Wt 180.0 lb

## 2016-07-04 DIAGNOSIS — R1032 Left lower quadrant pain: Secondary | ICD-10-CM | POA: Diagnosis not present

## 2016-07-04 NOTE — Progress Notes (Signed)
Patient ID: Alejandro Martinez, male   DOB: 11/05/1950, 66 y.o.   MRN: [id number]  Chief Complaint  Patient presents with  . Other    HPI Alejandro Martinez is a 66 y.o. male here today for a re evaluation of a left inguinal hernia. Patient states in the last three weeks the left inguinal area has been causing him some pain when he bends over. Moves his bowels daily.  HPI  No past medical history on file.  Past Surgical History:  Procedure Laterality Date  . INGUINAL HERNIA REPAIR Left 05/05/2015   Left direct inguinal hernia/lipoma excision, large Ultra Pro mesh.;  Surgeon: Robert Bellow, MD;  Location: [facility] ORS;  Service: General;  Laterality: Left;  . LIPOMA EXCISION Left 05/05/2015   Procedure: EXCISION OF LEFT INGIUNAL  LIPOMA;  Surgeon: Robert Bellow, MD;  Location: [facility] ORS;  Service: General;  Laterality: Left;  . NO PAST SURGERIES      Family History  Problem Relation Age of Onset  . Cancer Neg Hx     Social History Social History  Substance Use Topics  . Smoking status: Never Smoker  . Smokeless tobacco: Never Used  . Alcohol use No    No Known Allergies  No current outpatient prescriptions on file.   No current facility-administered medications for this visit.     Review of Systems Review of Systems  Constitutional: Negative.   Respiratory: Negative.   Cardiovascular: Negative.     Blood pressure 130/80, pulse 80, resp. rate 12, height 5\' 8"  (1.727 m), weight 180 lb (81.6 kg).  Physical Exam Physical Exam  Constitutional: He is oriented to person, place, and time. He appears well-developed and well-nourished.  Abdominal: No hernia.  Left inguinal hernia is intact  Genitourinary:     Neurological: He is alert and oriented to person, place, and time.  Skin: Skin is warm and dry.       Assessment    No evidence of recurrent hernia.    Plan    The patient was reassured that there has been no product recall on the mesh utilized. Local  discomfort may be related to weight gain and compression of the area during hip flexion. No indication to change activity plans.    Patient to return as needed.  HPI, Physical Exam, Assessment and Plan have been scribed under the direction and in the presence of Hervey Ard, MD.  Gaspar Cola, CMA  I have completed the exam and reviewed the above documentation for accuracy and completeness.  I agree with the above.  Haematologist has been used and any errors in dictation or transcription are unintentional.  Hervey Ard, M.D., F.A.C.S.   Robert Bellow 07/04/2016, 8:38 PM

## 2016-07-04 NOTE — Patient Instructions (Signed)
Patient to return as needed. 

## 2017-05-25 IMAGING — CT CT RENAL STONE PROTOCOL
2 of 4 series · 16 of 46 positions shown, 18 images · non-contrast
Comparison: None.

CLINICAL DATA: Right-sided flank pain beginning yesterday.

EXAM:
CT ABDOMEN AND PELVIS WITHOUT CONTRAST
TECHNIQUE: Multidetector CT imaging of the abdomen and pelvis was performed
following the standard protocol without IV contrast.

[Series 2: axial st · axial · 0.72mm/px · z∈[-891,-446]mm · 13 of 99 slices shown, 15 images]
[im 5/99  soft-tissue]
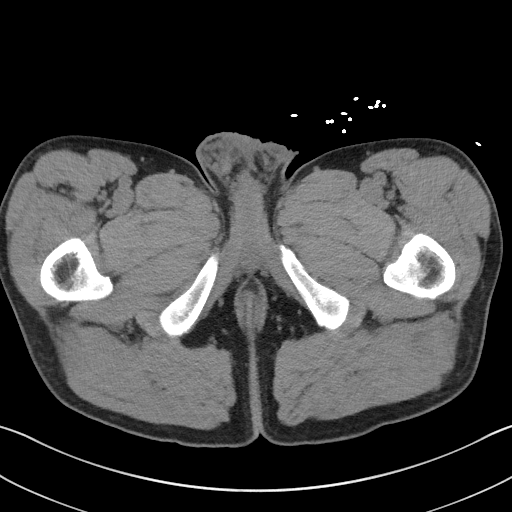
[im 5/99  bone]
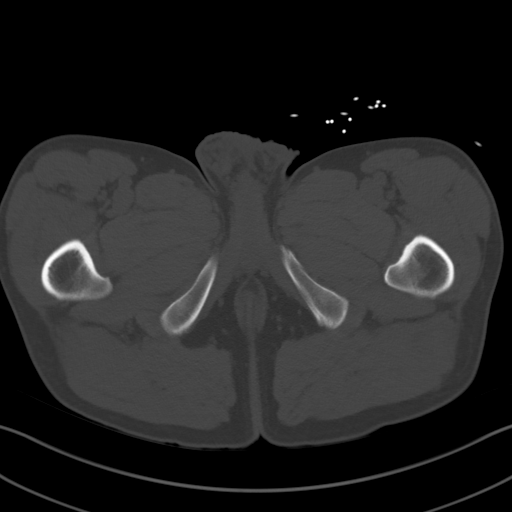
[im 13/99  soft-tissue]
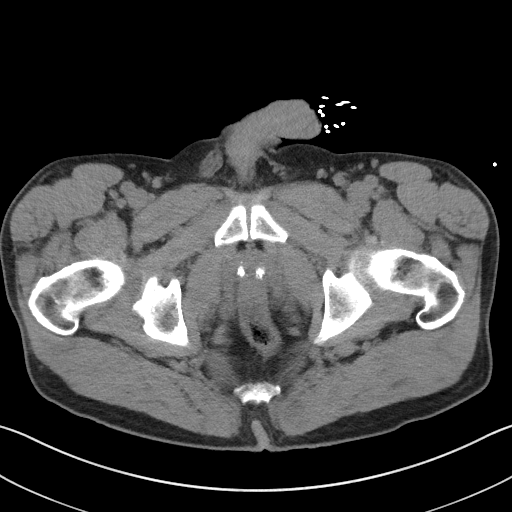
[im 22/99  soft-tissue]
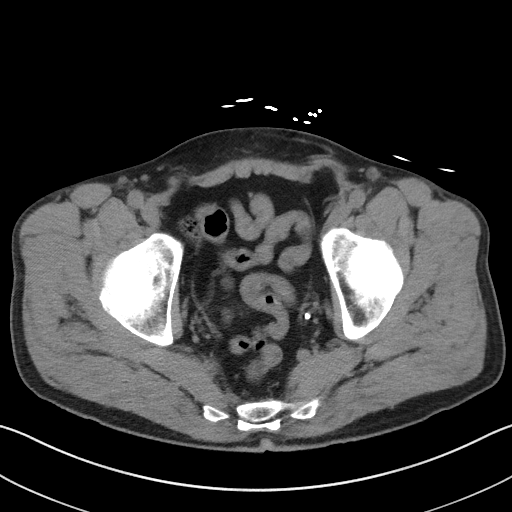
[im 26/99  soft-tissue]
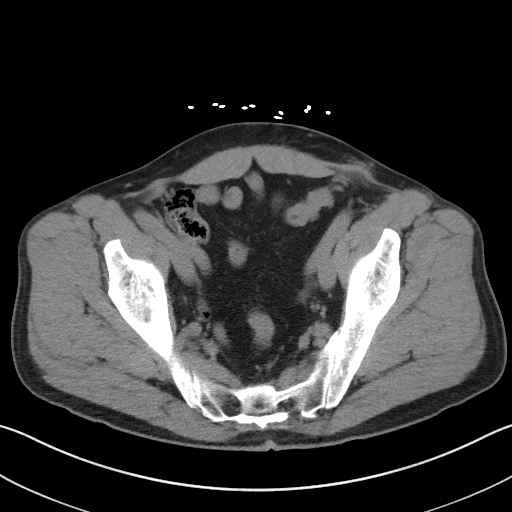
[im 35/99  soft-tissue]
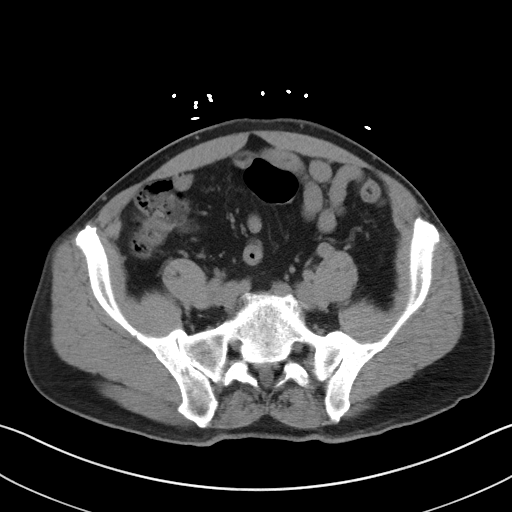
[im 43/99  soft-tissue]
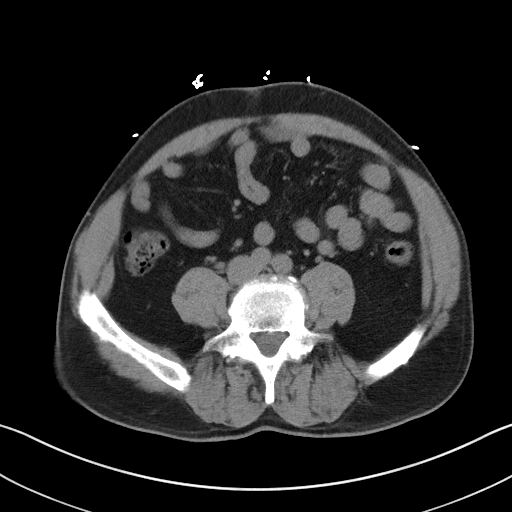
[im 52/99  soft-tissue]
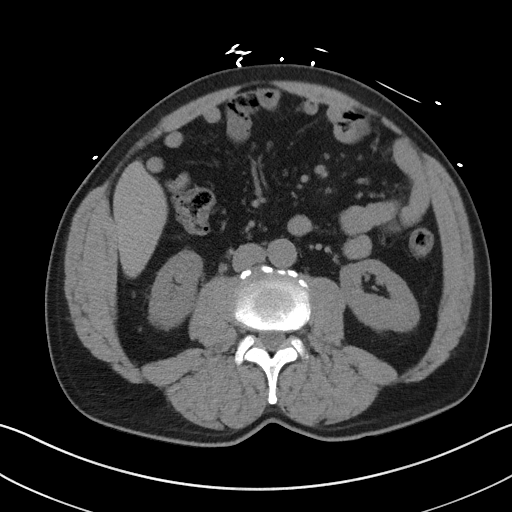
[im 56/99  soft-tissue]
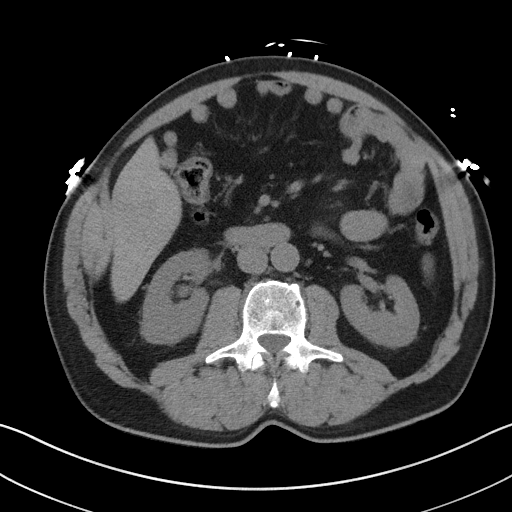
[im 64/99  soft-tissue]
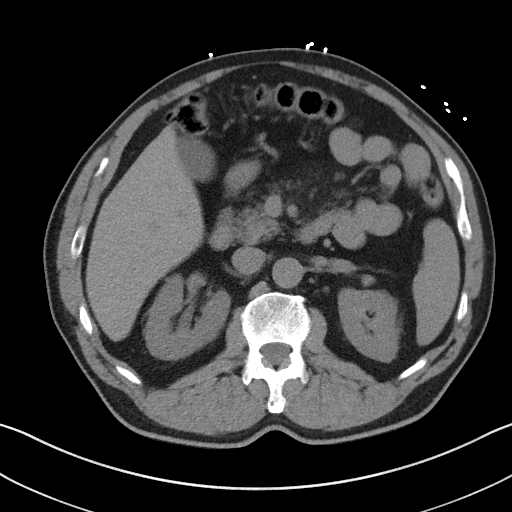
[im 64/99  bone]
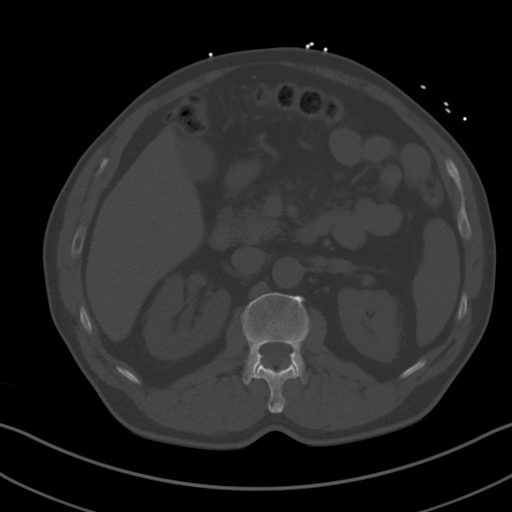
[im 73/99  soft-tissue]
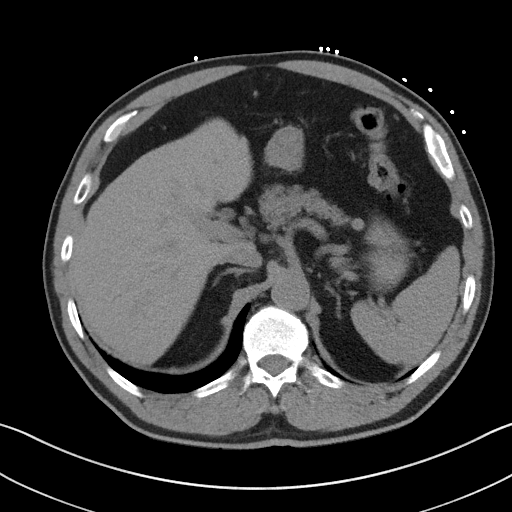
[im 77/99  soft-tissue]
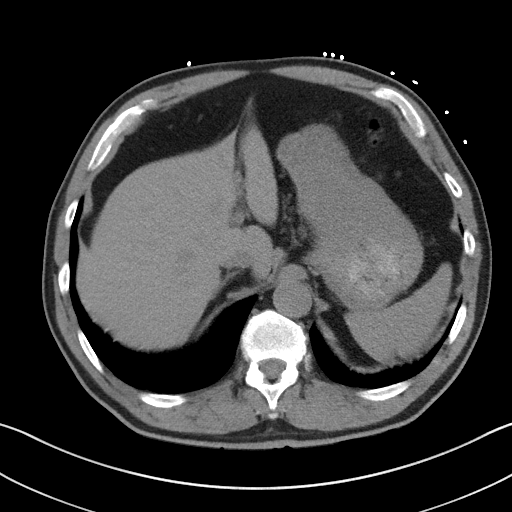
[im 86/99  soft-tissue]
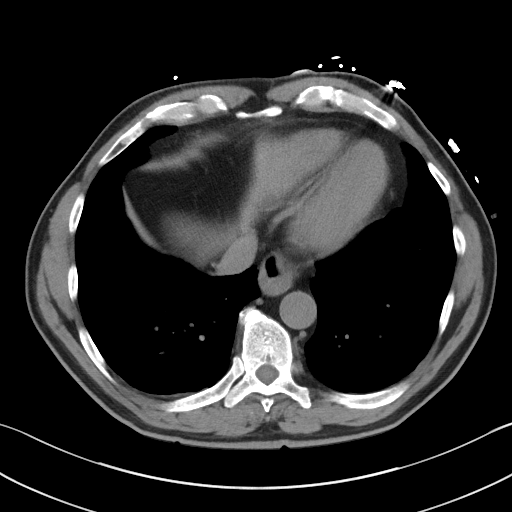
[im 94/99  soft-tissue]
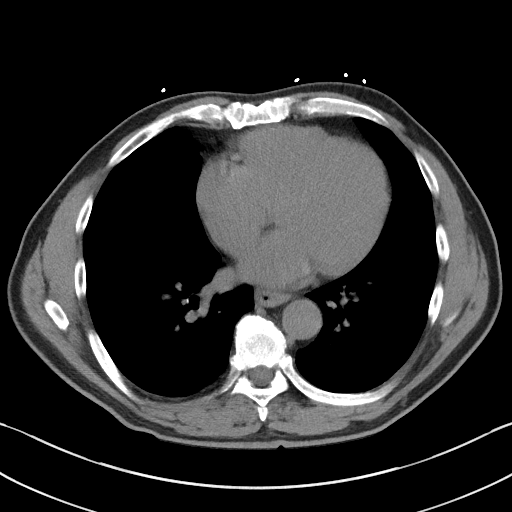

[Series 5: coronal · coronal · 0.71mm/px · 3 of 131 slices shown]
[im 44/131  soft-tissue]
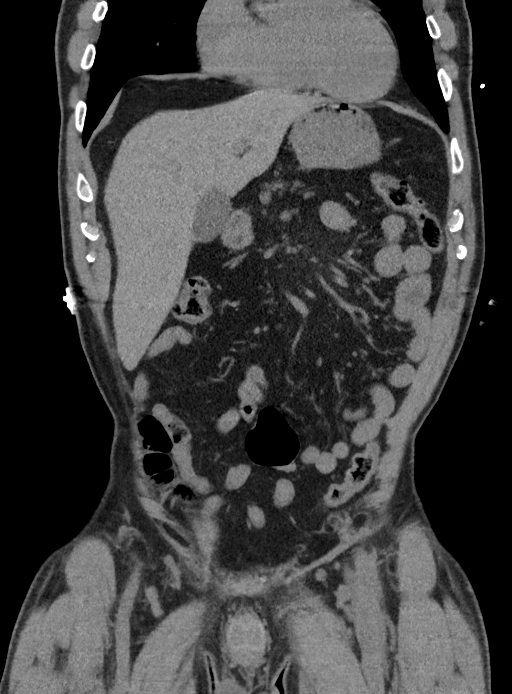
[im 58/131  soft-tissue]
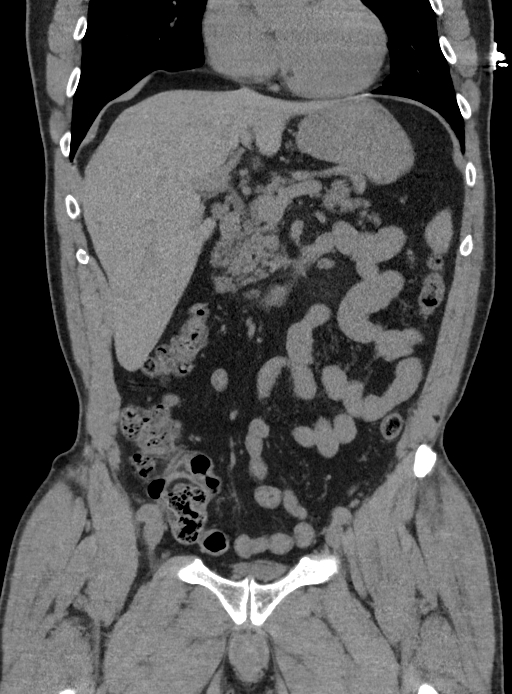
[im 73/131  soft-tissue]
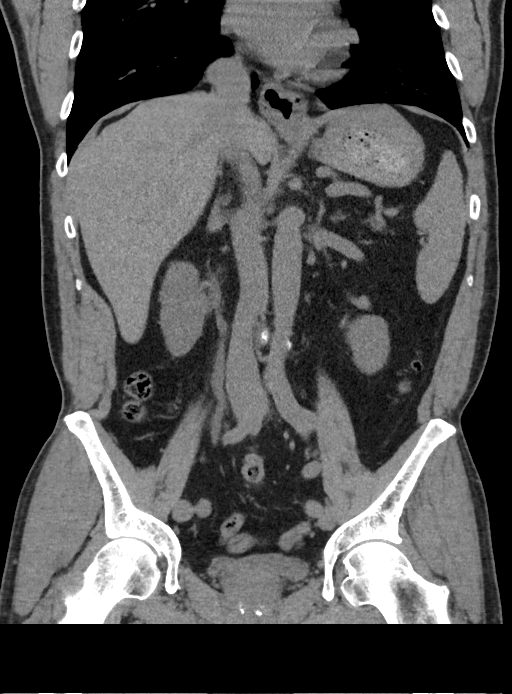

[16 of 46 positions shown; findings below may reference images not displayed]

FINDINGS: Lower chest: Minimal linear atelectasis or scar at the lung bases.
No pleural or pericardial fluid. Small hiatal hernia.

Hepatobiliary: Normal without contrast.  No calcified gallstones.

Pancreas: Normal

Spleen: Normal

Adrenals/Urinary Tract: Adrenal glands are normal. The left kidney
is normal. No cyst, mass, stone or hydronephrosis. The right kidney
contains 2 nonobstructing 2 mm stones in the lower pole. There is
mild fullness of the renal collecting system and ureter with a stone
at the pelvic brim level measuring 3.5-4 mm. No stone distal to that
or in the bladder.

Stomach/Bowel: No acute bowel pathology. Diverticulosis without
evidence of diverticulitis.

Vascular/Lymphatic: Aorta and IVC are normal. No retroperitoneal
mass or lymphadenopathy.

Reproductive: Normal

Other: Previous left inguinal hernia repair.

Musculoskeletal: Ordinary lower lumbar degenerative changes.
IMPRESSION: 3.5-4 mm stone in the right ureter at the pelvic brim level with
mild right hydroureteronephrosis. Two tiny (2 mm) nonobstructing
stones in the lower pole the right kidney.

## 2019-05-10 ENCOUNTER — Ambulatory Visit: Payer: 59 | Attending: Internal Medicine

## 2019-05-10 DIAGNOSIS — Z23 Encounter for immunization: Secondary | ICD-10-CM | POA: Insufficient documentation

## 2019-05-10 NOTE — Progress Notes (Signed)
   Covid-19 Vaccination Clinic  Name:  Alejandro Martinez    MRN: [id number] DOB: Jan 08, 1951  05/10/2019  Mr. Alejandro Martinez was observed post Covid-19 immunization for 15 minutes without incidence. He was provided with Vaccine Information Sheet and instruction to access the V-Safe system.   Mr. Alejandro Martinez was instructed to call 911 with any severe reactions post vaccine: Marland Kitchen Difficulty breathing  . Swelling of your face and throat  . A fast heartbeat  . A bad rash all over your body  . Dizziness and weakness    Immunizations Administered    Name Date Dose VIS Date Route   Pfizer COVID-19 Vaccine 05/10/2019 11:33 AM 0.3 mL 03/06/2019 Intramuscular   Manufacturer: [place]   Lot: [id number]   [place]: [id number]

## 2019-06-09 ENCOUNTER — Ambulatory Visit: Payer: 59 | Attending: Internal Medicine

## 2019-06-09 DIAGNOSIS — Z23 Encounter for immunization: Secondary | ICD-10-CM

## 2019-06-09 NOTE — Progress Notes (Signed)
   Covid-19 Vaccination Clinic  Name:  Alejandro Martinez    MRN: [id number] DOB: 09/27/1950  06/09/2019  [name] was observed post Covid-19 immunization for 15 minutes without incident. He was provided with Vaccine Information Sheet and instruction to access the V-Safe system.   [name] was instructed to call 911 with any severe reactions post vaccine: Marland Kitchen Difficulty breathing  . Swelling of face and throat  . A fast heartbeat  . A bad rash all over body  . Dizziness and weakness   Immunizations Administered    Name Date Dose VIS Date Route   Pfizer COVID-19 Vaccine 06/09/2019 11:18 AM 0.3 mL 03/06/2019 Intramuscular   Manufacturer: [place]   Lot: [id number]   [place]: [id number]
